# Patient Record
Sex: Female | Born: 1985 | Race: Black or African American | Hispanic: No | Marital: Single | Smoking: Current every day smoker
Health system: Southern US, Community
[De-identification: ages and names within clinical notes are randomized; demographics above are authoritative.]

## PROBLEM LIST (undated history)

## (undated) DIAGNOSIS — I10 Essential (primary) hypertension: Secondary | ICD-10-CM

## (undated) DIAGNOSIS — E119 Type 2 diabetes mellitus without complications: Secondary | ICD-10-CM

---
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
## 2006-02-20 ENCOUNTER — Ambulatory Visit: Payer: Self-pay | Admitting: Pediatrics

## 2006-02-20 ENCOUNTER — Other Ambulatory Visit: Payer: Self-pay

## 2007-08-13 ENCOUNTER — Emergency Department: Payer: Self-pay | Admitting: Emergency Medicine

## 2008-05-23 ENCOUNTER — Ambulatory Visit: Payer: Self-pay | Admitting: Gastroenterology

## 2008-05-30 ENCOUNTER — Ambulatory Visit: Payer: Self-pay | Admitting: Urology

## 2020-07-13 ENCOUNTER — Emergency Department: Payer: Medicaid Other

## 2020-07-13 ENCOUNTER — Emergency Department
Admission: EM | Admit: 2020-07-13 | Discharge: 2020-07-13 | Disposition: A | Discharge status: Home / Self Care | Payer: Medicaid Other | Attending: Emergency Medicine | Admitting: Emergency Medicine

## 2020-07-13 ENCOUNTER — Other Ambulatory Visit: Payer: Self-pay

## 2020-07-13 ENCOUNTER — Encounter: Payer: Self-pay | Admitting: Emergency Medicine

## 2020-07-13 ENCOUNTER — Ambulatory Visit
Admission: EM | Admit: 2020-07-13 | Discharge: 2020-07-13 | Disposition: A | Discharge status: Home / Self Care | Payer: Medicaid Other

## 2020-07-13 DIAGNOSIS — K5909 Other constipation: Secondary | ICD-10-CM

## 2020-07-13 DIAGNOSIS — E119 Type 2 diabetes mellitus without complications: Secondary | ICD-10-CM | POA: Insufficient documentation

## 2020-07-13 DIAGNOSIS — R1031 Right lower quadrant pain: Secondary | ICD-10-CM

## 2020-07-13 DIAGNOSIS — Z7984 Long term (current) use of oral hypoglycemic drugs: Secondary | ICD-10-CM | POA: Diagnosis not present

## 2020-07-13 DIAGNOSIS — I10 Essential (primary) hypertension: Secondary | ICD-10-CM | POA: Diagnosis not present

## 2020-07-13 DIAGNOSIS — F17290 Nicotine dependence, other tobacco product, uncomplicated: Secondary | ICD-10-CM | POA: Diagnosis not present

## 2020-07-13 DIAGNOSIS — I160 Hypertensive urgency: Secondary | ICD-10-CM

## 2020-07-13 HISTORY — DX: Essential (primary) hypertension: I10

## 2020-07-13 HISTORY — DX: Type 2 diabetes mellitus without complications: E11.9

## 2020-07-13 LAB — CBC
HCT: 39.7 % (ref 36.0–46.0)
Hemoglobin: 13.1 g/dL (ref 12.0–15.0)
MCH: 30 pg (ref 26.0–34.0)
MCHC: 33 g/dL (ref 30.0–36.0)
MCV: 91.1 fL (ref 80.0–100.0)
Platelets: 231 10*3/uL (ref 150–400)
RBC: 4.36 MIL/uL (ref 3.87–5.11)
RDW: 13.7 % (ref 11.5–15.5)
WBC: 9.5 10*3/uL (ref 4.0–10.5)
nRBC: 0 % (ref 0.0–0.2)

## 2020-07-13 LAB — POC URINE PREG, ED: Preg Test, Ur: NEGATIVE

## 2020-07-13 LAB — URINALYSIS, COMPLETE (UACMP) WITH MICROSCOPIC
Bacteria, UA: NONE SEEN
Bilirubin Urine: NEGATIVE
Glucose, UA: NEGATIVE mg/dL
Ketones, ur: NEGATIVE mg/dL
Leukocytes,Ua: NEGATIVE
Nitrite: NEGATIVE
Protein, ur: 30 mg/dL — AB
Specific Gravity, Urine: 1.012 (ref 1.005–1.030)
pH: 5 (ref 5.0–8.0)

## 2020-07-13 LAB — COMPREHENSIVE METABOLIC PANEL
ALT: 25 U/L (ref 0–44)
AST: 47 U/L — ABNORMAL HIGH (ref 15–41)
Albumin: 4.2 g/dL (ref 3.5–5.0)
Alkaline Phosphatase: 49 U/L (ref 38–126)
Anion gap: 14 (ref 5–15)
BUN: 11 mg/dL (ref 6–20)
CO2: 23 mmol/L (ref 22–32)
Calcium: 9.2 mg/dL (ref 8.9–10.3)
Chloride: 99 mmol/L (ref 98–111)
Creatinine, Ser: 0.88 mg/dL (ref 0.44–1.00)
GFR, Estimated: >60 mL/min (ref >60)
Glucose, Bld: 104 mg/dL — ABNORMAL HIGH (ref 70–99)
Potassium: 2.9 mmol/L — ABNORMAL LOW (ref 3.5–5.1)
Sodium: 136 mmol/L (ref 135–145)
Total Bilirubin: 0.6 mg/dL (ref 0.3–1.2)
Total Protein: 7.8 g/dL (ref 6.5–8.1)

## 2020-07-13 LAB — LIPASE, BLOOD: Lipase: 29 U/L (ref 11–51)

## 2020-07-13 MED ORDER — IOHEXOL 300 MG/ML  SOLN
100.0000 mL | Freq: Once | INTRAMUSCULAR | Status: AC | PRN
  Administered 2020-07-13: 100 mL via INTRAVENOUS

## 2020-07-13 MED ORDER — ONDANSETRON HCL 4 MG/2ML IJ SOLN
4.0000 mg | Freq: Once | INTRAMUSCULAR | Status: AC
  Administered 2020-07-13: 4 mg via INTRAVENOUS
  Filled 2020-07-13: qty 2

## 2020-07-13 MED ORDER — POTASSIUM CHLORIDE CRYS ER 20 MEQ PO TBCR
40.0000 meq | EXTENDED_RELEASE_TABLET | Freq: Once | ORAL | Status: AC
  Administered 2020-07-13: 40 meq via ORAL
  Filled 2020-07-13: qty 2

## 2020-07-13 MED ORDER — MORPHINE SULFATE (PF) 4 MG/ML IV SOLN
4.0000 mg | Freq: Once | INTRAVENOUS | Status: AC
Start: 2020-07-13 — End: 2020-07-13
  Administered 2020-07-13: 4 mg via INTRAVENOUS
  Filled 2020-07-13: qty 1

## 2020-07-13 MED ORDER — LACTATED RINGERS IV BOLUS
1000.0000 mL | Freq: Once | INTRAVENOUS | Status: AC
  Administered 2020-07-13: 1000 mL via INTRAVENOUS

## 2020-07-13 NOTE — Discharge Instructions (Addendum)
You were seen for abdominal pain.   Head to the local ER for an abdominal CT to rule out appendicitis.  Start your blood pressure medications ASAP.   Take care, Dr. Sharlet Salina, NP-c

## 2020-07-13 NOTE — ED Provider Notes (Signed)
[facility] Department Provider Note ____________________________________________   Event Date/Time   First MD Initiated Contact with Patient 07/13/20 1041     (approximate)  I have reviewed the triage vital signs and the nursing notes.  HISTORY  Chief Complaint Abdominal Pain   HPI Tanya Garcia is a 35 y.o. femalewho presents to the ED for evaluation of abdominal pain and constipation.  Chart review indicates patient of obesity, HTN and DM.  She was seen in urgent care this morning for the same complaints.  There were concerns for hypertension and acute appendicitis, and so she was directed to the ED for evaluation.  Patient presents to the ED with 2 days of lower quadrant abdominal pain, gassiness and bloating sensation. She reports constant pain with associated sensation of the need to pass a bowel movement, and she reports passing a bowel movement with minimal change to her symptoms. She reports continued to feel bloated and uncomfortable. She denies dysuria, hematuria, vaginal discharge or bleeding, emesis, falls or trauma.   Past Medical History:  Diagnosis Date  . Diabetes mellitus without complication (HCC)   . Hypertension     There are no problems to display for this patient.   History reviewed. No pertinent surgical history.  Prior to Admission medications   Medication Sig Start Date End Date Taking? Authorizing Provider  metFORMIN (GLUCOPHAGE-XR) 500 MG 24 hr tablet Take by mouth. 06/15/20   [provider]    Allergies Patient has no known allergies.  No family history on file.  Social History Social History   Tobacco Use  . Smoking status: Current Every Day Smoker    Types: E-cigarettes  . Smokeless tobacco: Never Used  Vaping Use  . Vaping Use: Never used  Substance Use Topics  . Alcohol use: Yes  . Drug use: Never    Review of Systems  Constitutional: No fever/chills Eyes: No visual  changes. ENT: No sore throat. Cardiovascular: Denies chest pain. Respiratory: Denies shortness of breath. Gastrointestinal: Positive for abdominal pain and nausea no vomiting.  No diarrhea.  No constipation. Genitourinary: Negative for dysuria. Musculoskeletal: Negative for back pain. Skin: Negative for rash. Neurological: Negative for headaches, focal weakness or numbness.  ____________________________________________   PHYSICAL EXAM:  VITAL SIGNS: Vitals:   07/13/20 1141 07/13/20 1335  BP: (!) 164/109 (!) 177/112  Pulse: 89 99  Resp: 20 18  Temp:  98.2 F (36.8 C)  SpO2: 100% 100%      Constitutional: Alert and oriented. Well appearing and in no acute distress. Eyes: Conjunctivae are normal. PERRL. EOMI. Head: Atraumatic. Nose: No congestion/rhinnorhea. Mouth/Throat: Mucous membranes are moist.  Oropharynx non-erythematous. Neck: No stridor. No cervical spine tenderness to palpation. Cardiovascular: Normal rate, regular rhythm. Grossly normal heart sounds.  Good peripheral circulation. Respiratory: Normal respiratory effort.  No retractions. Lungs CTAB. Gastrointestinal: Soft , nondistended.  Poorly localizing and mild right-sided, suprapubic, right flank and right-sided CVA tenderness. No localizing features and no peritoneal features. Abdomen is otherwise benign. No overlying skin changes or signs of trauma. Musculoskeletal: No lower extremity tenderness nor edema.  No joint effusions. No signs of acute trauma. Neurologic:  Normal speech and language. No gross focal neurologic deficits are appreciated. No gait instability noted. Skin:  Skin is warm, dry and intact. No rash noted. Psychiatric: Mood and affect are normal. Speech and behavior are normal.  ____________________________________________   LABS (all labs ordered are listed, but only abnormal results are displayed)  Labs Reviewed  COMPREHENSIVE  METABOLIC PANEL - Abnormal; Notable for the following  components:      Result Value   Potassium 2.9 (*)    Glucose, Bld 104 (*)    AST 47 (*)    All other components within normal limits  URINALYSIS, COMPLETE (UACMP) WITH MICROSCOPIC - Abnormal; Notable for the following components:   Color, Urine YELLOW (*)    APPearance HAZY (*)    Hgb urine dipstick MODERATE (*)    Protein, ur 30 (*)    All other components within normal limits  LIPASE, BLOOD  CBC  POC URINE PREG, ED   ____________________________________________  RADIOLOGY  ED MD interpretation: CT abdomen/pelvis reviewed by me without evidence of acute intra-abdominal pathology.  Official radiology report(s): CT ABDOMEN PELVIS W CONTRAST  Result Date: 07/13/2020 CLINICAL DATA:  Abdominal pain, primarily right-sided EXAM: CT ABDOMEN AND PELVIS WITH CONTRAST TECHNIQUE: Multidetector CT imaging of the abdomen and pelvis was performed using the standard protocol following bolus administration of intravenous contrast. CONTRAST:  OMNIPAQUE IOHEXOL 300 MG/ML  SOLN COMPARISON:  May 30, 2008. FINDINGS: Lower chest: Lung bases are clear. Hepatobiliary: No focal liver lesions are appreciable. Gallbladder wall is not appreciably thickened. There is no biliary duct dilatation. Pancreas: There is no appreciable pancreatic mass or inflammatory focus. Spleen: No splenic lesions are evident. Adrenals/Urinary Tract: Adrenals bilaterally appear normal. There is no evident renal mass or hydronephrosis on either side. There is no evident renal or ureteral calculus on either side. Urinary bladder is midline with wall thickness within normal limits. Stomach/Bowel: There is no appreciable bowel wall or mesenteric thickening. No evident bowel obstruction. The terminal ileum appears normal. There is no appreciable free air or portal venous air. Vascular/Lymphatic: There is no abdominal aortic aneurysm. No arterial vascular lesions are evident. Major venous structures appear patent. There is no evident  adenopathy in the abdomen or pelvis. Reproductive: The uterus is anteverted. No adnexal/pelvic mass evident. Other: Appendix appears normal. There is no evident ascites or abscess in the abdomen or pelvis. There is slight fat in the umbilicus. Musculoskeletal: There are no appreciable blastic or lytic bone lesions. No intramuscular or abdominal wall lesions are evident. IMPRESSION: 1. A cause for patient's symptoms has not been established with this study. 2. Normal appearing appendix. No bowel wall thickening or bowel obstruction. No abscess in the abdomen or pelvis. 3. No evident renal or ureteral calculus. No hydronephrosis. Urinary bladder wall thickness is within normal limits. Electronically Signed   By: Bretta Bang III M.D.   On: 07/13/2020 12:24    ____________________________________________   PROCEDURES and INTERVENTIONS  Procedure(s) performed (including Critical Care):  .1-3 Lead EKG Interpretation Performed by: Delton Prairie, MD Authorized by: Delton Prairie, MD     Interpretation: normal     ECG rate:  90   ECG rate assessment: normal     Rhythm: sinus rhythm     Ectopy: none     Conduction: normal      Medications  ondansetron (ZOFRAN) injection 4 mg (4 mg Intravenous Given 07/13/20 1137)  lactated ringers bolus 1,000 mL (0 mLs Intravenous Stopped 07/13/20 1335)  morphine 4 MG/ML injection 4 mg (4 mg Intravenous Given 07/13/20 1137)  iohexol (OMNIPAQUE) 300 MG/ML solution 100 mL (100 mLs Intravenous Contrast Given 07/13/20 1209)  potassium chloride SA (KLOR-CON) CR tablet 40 mEq (40 mEq Oral Given 07/13/20 1235)    ____________________________________________   MDM / ED COURSE   35 year old woman presents to the ED with RLQ abdominal  pain without evidence of underlying pathology, and amenable to outpatient management. Slightly hypertensive, likely at her baseline, otherwise normal vitals on room air. Exam with poorly localizing right-sided abdominal tenderness without  peritoneal features. She otherwise looks well without distress, signs of trauma/skin changes, or any neurovascular deficit. Blood work is reassuring. She has mild hypokalemia, that was repleted orally. Due to concern for possible appendicitis, diverticulitis or ovarian pathology, CT abdomen/pelvis obtained and shows no evidence of acute intra-abdominal pathology. Her pain is well controlled and I see no evidence of pathology to preclude outpatient management. Return precautions for the ED were discussed prior to discharge.   Clinical Course as of 07/13/20 1545  Sat Jul 13, 2020  1259 Reassessed.  Patient reports controlled pain.  We discussed possible alternative sources of pain beyond acute appendicitis or colitis.  We discussed her significant stressors being unemployed at this point with upcoming job interview.  We discussed outpatient management and we discussed return precautions for the ED.  Answered questions.  Provided support and guidance. [DS]    Clinical Course User Index [DS] Delton Prairie, MD    ____________________________________________   FINAL CLINICAL IMPRESSION(S) / ED DIAGNOSES  Final diagnoses:  Right lower quadrant abdominal pain  Other constipation     ED Discharge Orders    None       Johanny Segers Katrinka Blazing   Note:  This document was prepared using Dragon voice recognition software and may include unintentional dictation errors.   Delton Prairie, MD 07/13/20 [phone]

## 2020-07-13 NOTE — ED Provider Notes (Signed)
[facility] - [place], [place]   Name: Tanya Garcia DOB: 1986/05/19 MRN: [id number] CSN: [id number] PCP: Virgina Evener, MD  Arrival date and time:  07/13/20 [phone]  Chief Complaint:  Abdominal Pain and Constipation   NOTE: Prior to seeing the patient today, I have reviewed the triage nursing documentation and vital signs. Clinical staff has updated patient's PMH/PSHx, current medication list, and drug allergies/intolerances to ensure comprehensive history available to assist in medical decision making.   History:   HPI: Tanya Garcia is a 35 y.o. female who presents today with complaints of abdominal pain.  Patient states abdominal pain started yesterday.  This abdominal pain is not correlated with eating.  She states she still had a bowel movement yesterday with a small 1 being after she took some prune juice, but the abdominal pain and bloating has continued.  She has never had any similar symptoms.  Gas-X also tried to aid in bloating but minimal relief was noted.  She has gone to the point where she does not want to intake any food or water.  Appendix and gallbladder intact.  Patient has a diagnosis of hypertension, but she takes her medication, Procardia, "when she feels her blood pressure is high".  She has noticed some blurred vision, but no chest pain/pressure or shortness of breath.   Past Medical History:  Diagnosis Date  . Diabetes mellitus without complication (HCC)   . Hypertension     History reviewed. No pertinent surgical history.  History reviewed. No pertinent family history.  Social History   Tobacco Use  . Smoking status: Current Every Day Smoker    Types: E-cigarettes  . Smokeless tobacco: Never Used  Vaping Use  . Vaping Use: Never used  Substance Use Topics  . Alcohol use: Yes  . Drug use: Never    There are no problems to display for this patient.   Home Medications:    Current Meds  Medication Sig  . metFORMIN  (GLUCOPHAGE-XR) 500 MG 24 hr tablet Take by mouth.    Allergies:   Patient has no known allergies.  Review of Systems (ROS): Review of Systems  Constitutional: Positive for appetite change. Negative for fatigue and fever.  Respiratory: Negative for shortness of breath.   Cardiovascular: Negative for chest pain and palpitations.  Gastrointestinal: Positive for abdominal distention, abdominal pain and nausea. Negative for anal bleeding and blood in stool.  Psychiatric/Behavioral: The patient is nervous/anxious.      Vital Signs: Today's Vitals   07/13/20 0850 07/13/20 0853  BP:  (!) 176/114  Pulse:  88  Resp:  14  Temp:  98.2 F (36.8 C)  TempSrc:  Oral  SpO2:  100%  Weight: 189 lb (85.7 kg)   Height: 5\' 5"  (1.651 m)   PainSc: 7      Physical Exam: Physical Exam Vitals and nursing note reviewed.  Constitutional:      General: She is not in acute distress.    Appearance: She is well-developed. She is not ill-appearing or toxic-appearing.  Cardiovascular:     Rate and Rhythm: Normal rate.  Pulmonary:     Effort: Pulmonary effort is normal.     Breath sounds: Normal breath sounds.  Abdominal:     General: Bowel sounds are decreased.     Palpations: Abdomen is soft.     Tenderness: There is abdominal tenderness in the right lower quadrant and left upper quadrant. Positive signs include McBurney's sign and obturator sign.  Skin:    General: Skin is warm and dry.     Capillary Refill: Capillary refill takes less than 2 seconds.  Neurological:     General: No focal deficit present.     Mental Status: She is alert.      Urgent Care Treatments / Results:   LABS: PLEASE NOTE: all labs that were ordered this encounter are listed, however only abnormal results are displayed. Labs Reviewed - No data to display  EKG: -None  RADIOLOGY: No results found.  PROCEDURES: Procedures  MEDICATIONS RECEIVED THIS VISIT: Medications - No data to display  PERTINENT  CLINICAL COURSE NOTES/UPDATES:   Initial Impression / Assessment and Plan / Urgent Care Course:  Pertinent labs & imaging results that were available during my care of the patient were personally reviewed by me and considered in my medical decision making (see lab/imaging section of note for values and interpretations).  Tanya Garcia is a 35 y.o. female who presents to [facility] today with complaints of abdominal pain, diagnosed with right lower quadrant pain and hypertensive urgency.  Due to patient's presentation with a positive arbitrator and McBurney's sign, patient encouraged to seek further evaluation with an abdominal CT at a local emergency room.  While patient is hesitant, she understood the severity of seeking further evaluation.  All questions answered, all concerns addressed.  Patient also told to take blood pressure medication before heading to emergency department.  Final Clinical Impressions / Urgent Care Diagnoses:   Final diagnoses:  RLQ abdominal pain  Hypertensive urgency    New Prescriptions:  [place] Controlled Substance Registry consulted? Not Applicable  No orders of the defined types were placed in this encounter.     Discharge Instructions     You were seen for abdominal pain.   Head to the local ER for an abdominal CT to rule out appendicitis.  Start your blood pressure medications ASAP.   Take care, Dr. Sharlet Salina, NP-c     Recommended Follow up Care:  Patient encouraged to follow up with the following provider within the specified time frame, or sooner as dictated by the severity of her symptoms. As always, she was instructed that for any urgent/emergent care needs, she should seek care either here or in the emergency department for more immediate evaluation.   Bailey Mech, DNP, NP-c    Bailey Mech, NP 07/13/20 [phone]

## 2020-07-13 NOTE — ED Notes (Signed)
Taken to ct  

## 2020-07-13 NOTE — ED Triage Notes (Signed)
Pt to ED via POV, pt states that she has been having abdominal pain and constipation. Pt vomited x 1 yesterday, pt states that here was blood in the vomit. Pt states that she has been able to have a BM but states that she feels like she is not able to get it all out and she feels very bloated. Pt is in NAD.

## 2020-07-13 NOTE — ED Triage Notes (Signed)
Patient c/o abdominal pain and constipation that started yesterday.  Patient states that she tried drinking prune juice and gasX but nothing has worked.  Patient states that she had a small bowel movement yesterday.  Patient reports nausea.

## 2020-07-13 NOTE — ED Notes (Signed)
Pt given crackers and PB with diet coke

## 2020-07-13 NOTE — Discharge Instructions (Signed)
Please take Tylenol and ibuprofen/Advil for your pain.  It is safe to take them together, or to alternate them every few hours.  Take up to 1000mg of Tylenol at a time, up to 4 times per day.  Do not take more than 4000 mg of Tylenol in 24 hours.  For ibuprofen, take 400-600 mg, 4-5 times per day. ° ° °

## 2022-11-09 IMAGING — CT CT ABD-PELV W/ CM
2 of 4 series · 16 of 46 positions shown, 18 images · IV contrast (APPLIED)
Comparison: May 30, 2008.

CLINICAL DATA: Abdominal pain, primarily right-sided

EXAM:
CT ABDOMEN AND PELVIS WITH CONTRAST
TECHNIQUE: Multidetector CT imaging of the abdomen and pelvis was performed
using the standard protocol following bolus administration of
intravenous contrast.
CONTRAST:  100mL OMNIPAQUE IOHEXOL 300 MG/ML  SOLN

[Series 2: routine abd/pel with · axial · 0.98mm/px · z∈[-892,-472]mm · 13 of 92 slices shown, 15 images]
[im 4/92  soft-tissue]
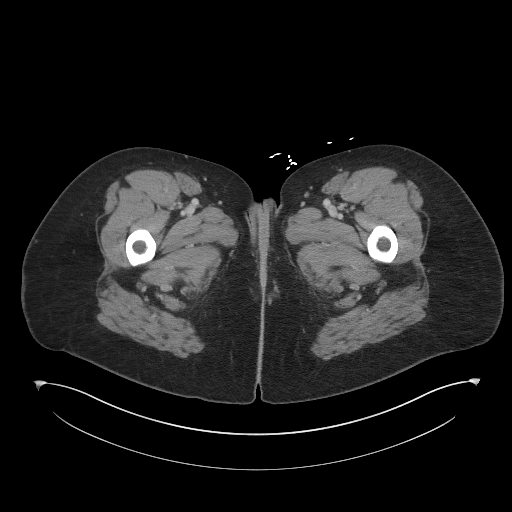
[im 4/92  bone]
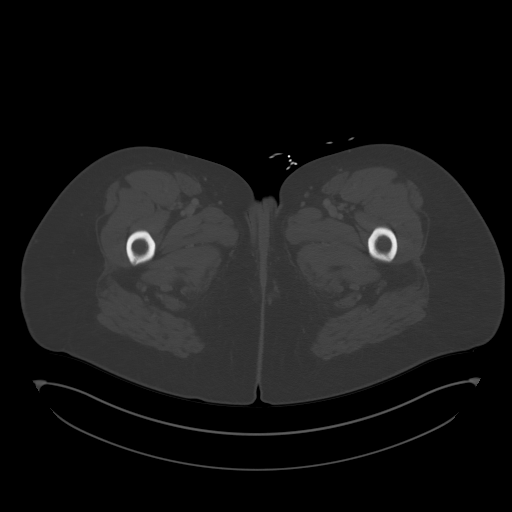
[im 12/92  soft-tissue]
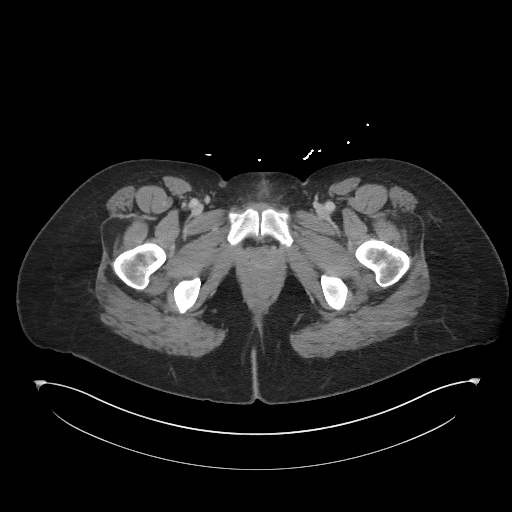
[im 20/92  soft-tissue]
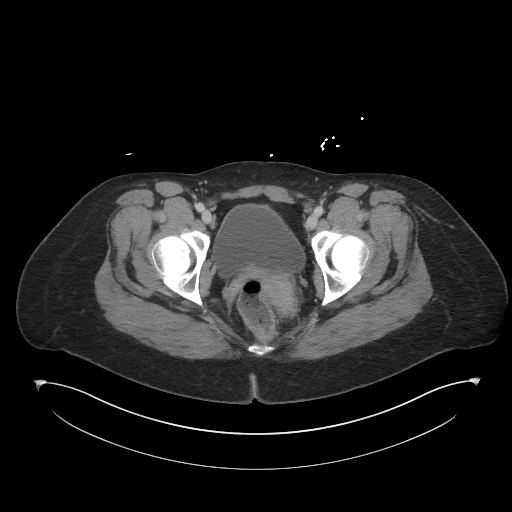
[im 24/92  soft-tissue]
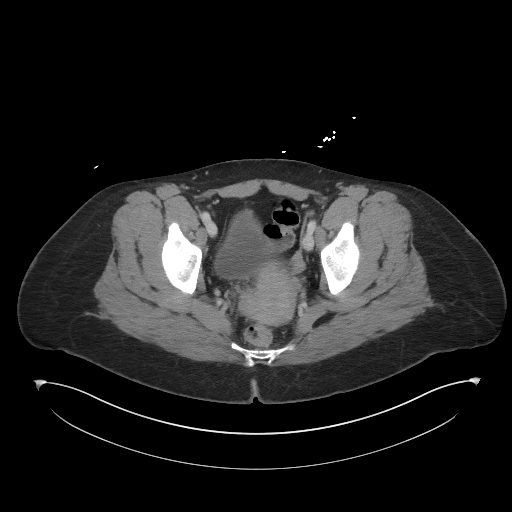
[im 32/92  soft-tissue]
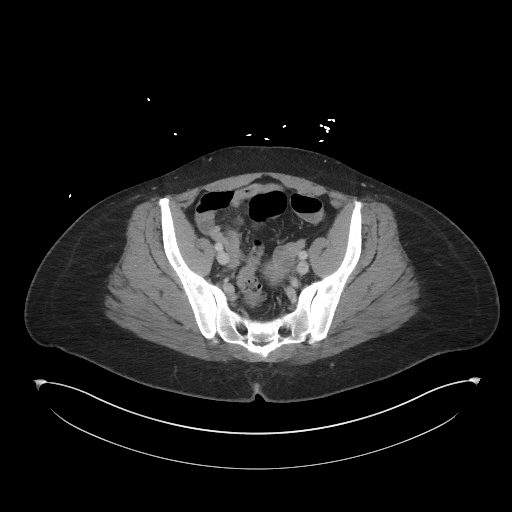
[im 40/92  soft-tissue]
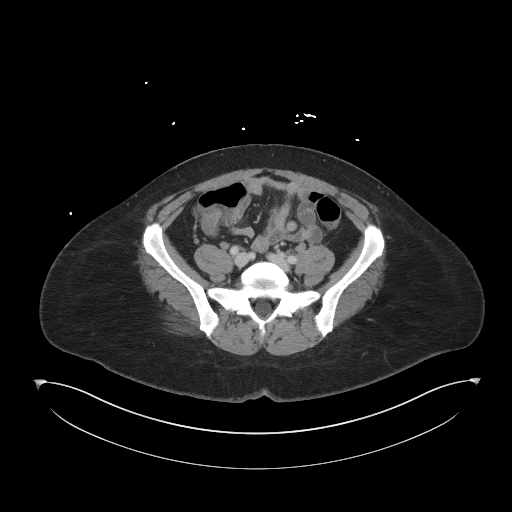
[im 48/92  soft-tissue]
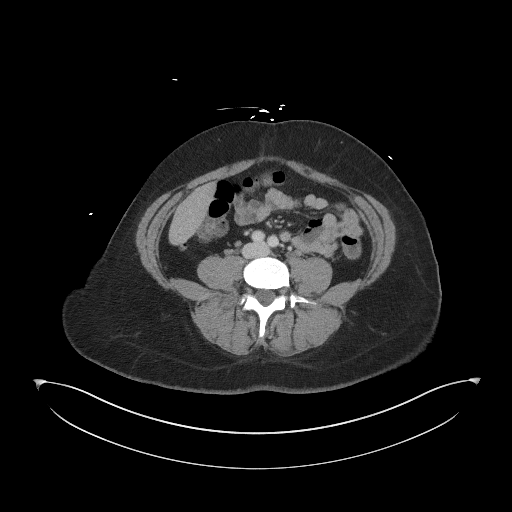
[im 52/92  soft-tissue]
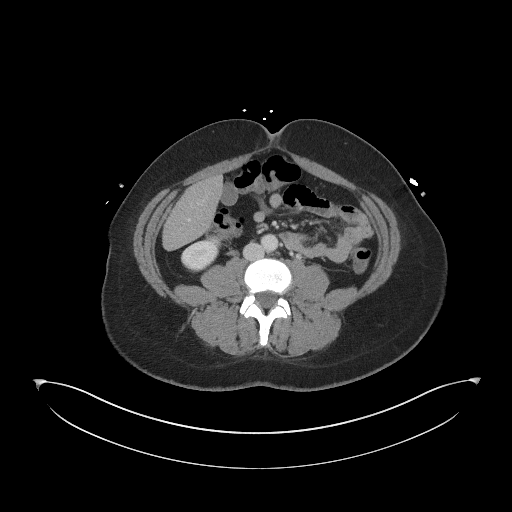
[im 60/92  soft-tissue]
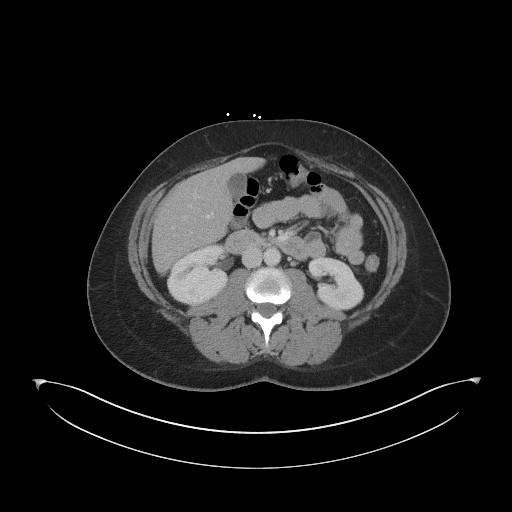
[im 60/92  bone]
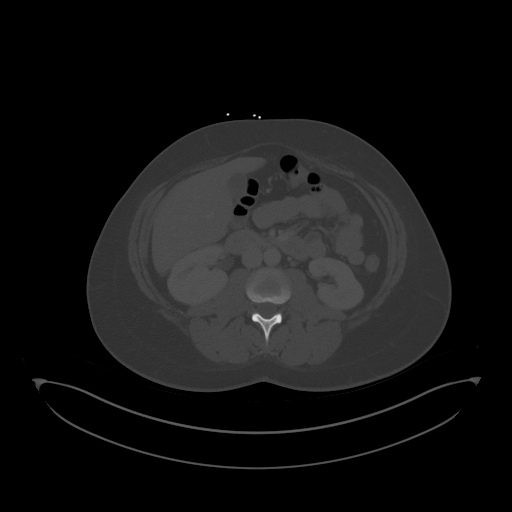
[im 68/92  soft-tissue]
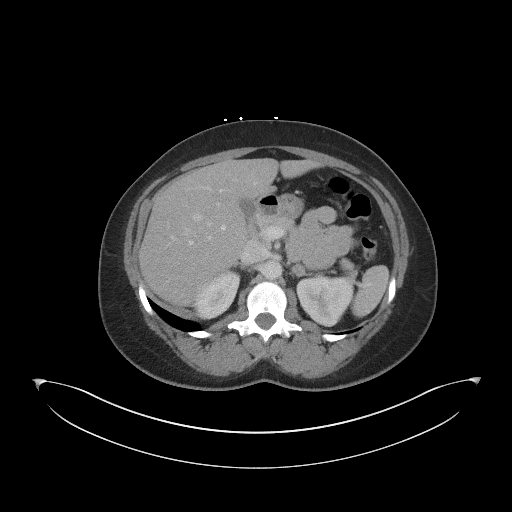
[im 72/92  soft-tissue]
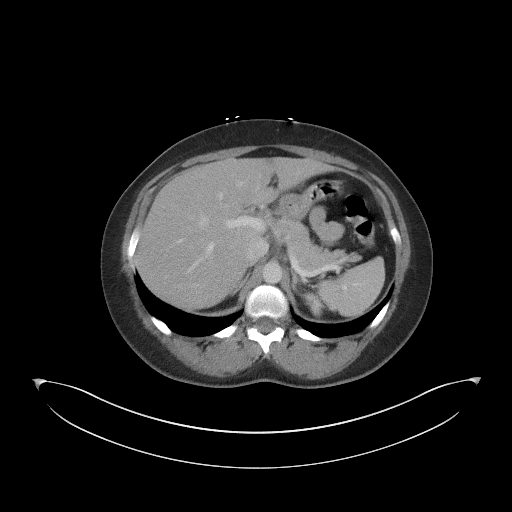
[im 80/92  soft-tissue]
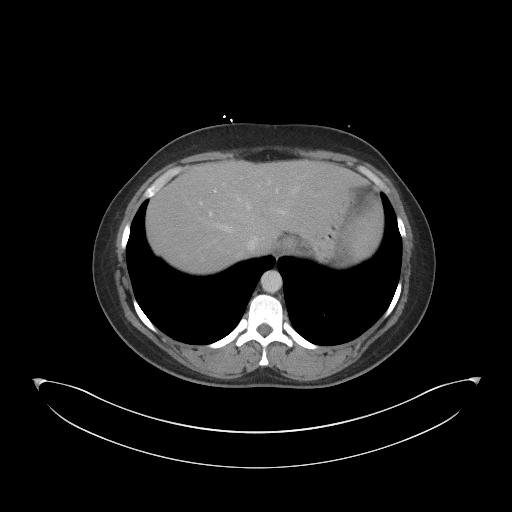
[im 88/92  soft-tissue]
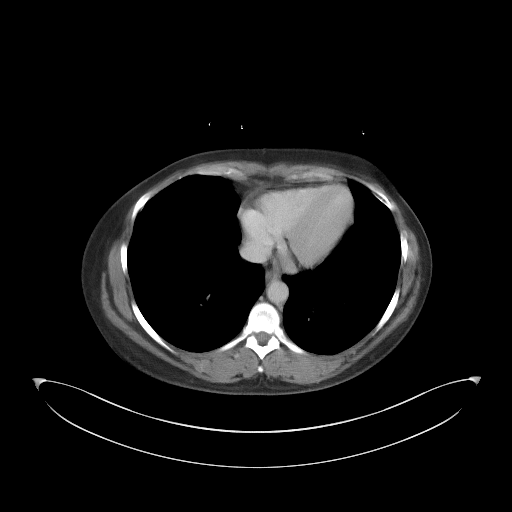

[Series 5: coronal st · coronal · 0.73mm/px · 3 of 91 slices shown]
[im 31/91  soft-tissue]
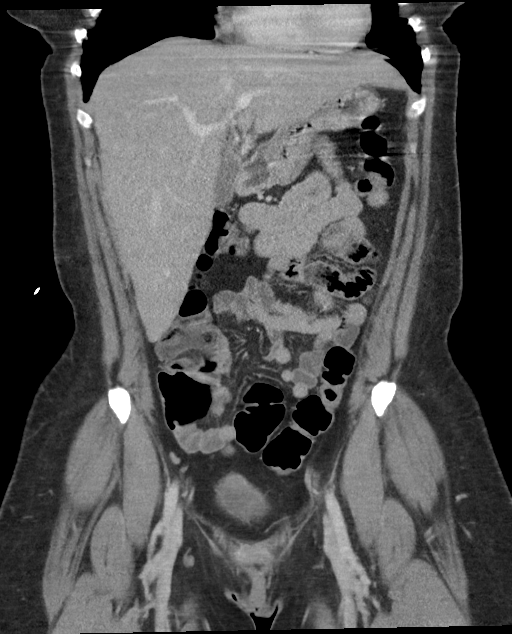
[im 41/91  soft-tissue]
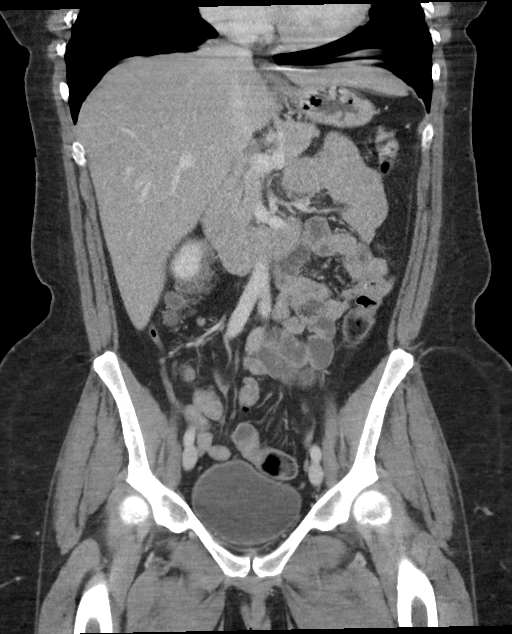
[im 51/91  soft-tissue]
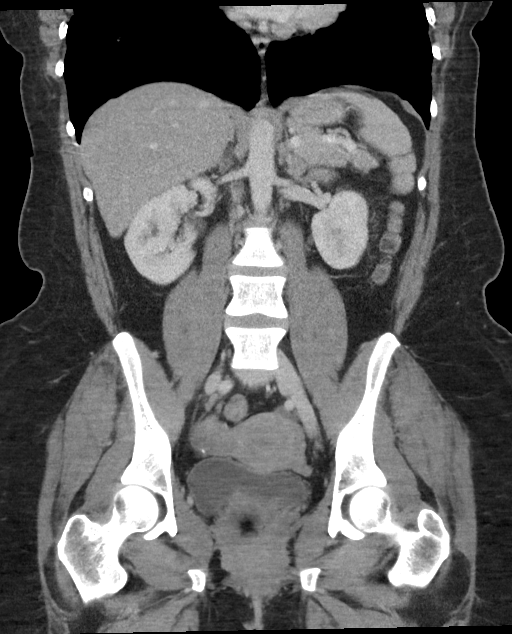

[16 of 46 positions shown; findings below may reference images not displayed]

FINDINGS: Lower chest: Lung bases are clear.

Hepatobiliary: No focal liver lesions are appreciable. Gallbladder
wall is not appreciably thickened. There is no biliary duct
dilatation.

Pancreas: There is no appreciable pancreatic mass or inflammatory
focus.

Spleen: No splenic lesions are evident.

Adrenals/Urinary Tract: Adrenals bilaterally appear normal. There is
no evident renal mass or hydronephrosis on either side. There is no
evident renal or ureteral calculus on either side. Urinary bladder
is midline with wall thickness within normal limits.

Stomach/Bowel: There is no appreciable bowel wall or mesenteric
thickening. No evident bowel obstruction. The terminal ileum appears
normal. There is no appreciable free air or portal venous air.

Vascular/Lymphatic: There is no abdominal aortic aneurysm. No
arterial vascular lesions are evident. Major venous structures
appear patent. There is no evident adenopathy in the abdomen or
pelvis.

Reproductive: The uterus is anteverted. No adnexal/pelvic mass
evident.

Other: Appendix appears normal. There is no evident ascites or
abscess in the abdomen or pelvis. There is slight fat in the
umbilicus.

Musculoskeletal: There are no appreciable blastic or lytic bone
lesions. No intramuscular or abdominal wall lesions are evident.
IMPRESSION: 1. A cause for patient's symptoms has not been established with this
study.

2. Normal appearing appendix. No bowel wall thickening or bowel
obstruction. No abscess in the abdomen or pelvis.

3. No evident renal or ureteral calculus. No hydronephrosis. Urinary
bladder wall thickness is within normal limits.
